# Patient Record
Sex: Male | Born: 1985 | Race: Black or African American | Hispanic: No | Smoking: Never smoker
Health system: Southern US, Community
[De-identification: ages and names within clinical notes are randomized; demographics above are authoritative.]

## PROBLEM LIST (undated history)

## (undated) ENCOUNTER — Emergency Department (HOSPITAL_COMMUNITY): Disposition: A | Discharge status: Home / Self Care | Payer: Self-pay

## (undated) HISTORY — PX: ARTHROSCOPIC REPAIR ACL: SUR80

---

## 2008-12-10 ENCOUNTER — Emergency Department (HOSPITAL_COMMUNITY): Admission: EM | Admit: 2008-12-10 | Discharge: 2008-12-10 | Payer: Self-pay | Admitting: Emergency Medicine

## 2009-03-20 ENCOUNTER — Emergency Department (HOSPITAL_COMMUNITY): Admission: EM | Admit: 2009-03-20 | Discharge: 2009-03-20 | Payer: Self-pay | Admitting: Emergency Medicine

## 2010-01-06 IMAGING — CR DG KNEE COMPLETE 4+V*R*
4 series · 4 of 4 positions shown · non-contrast
Comparison: None

CLINICAL DATA: Primarily medially.  Trauma.

RIGHT KNEE - COMPLETE 4+ VIEW

[t knee lat right]
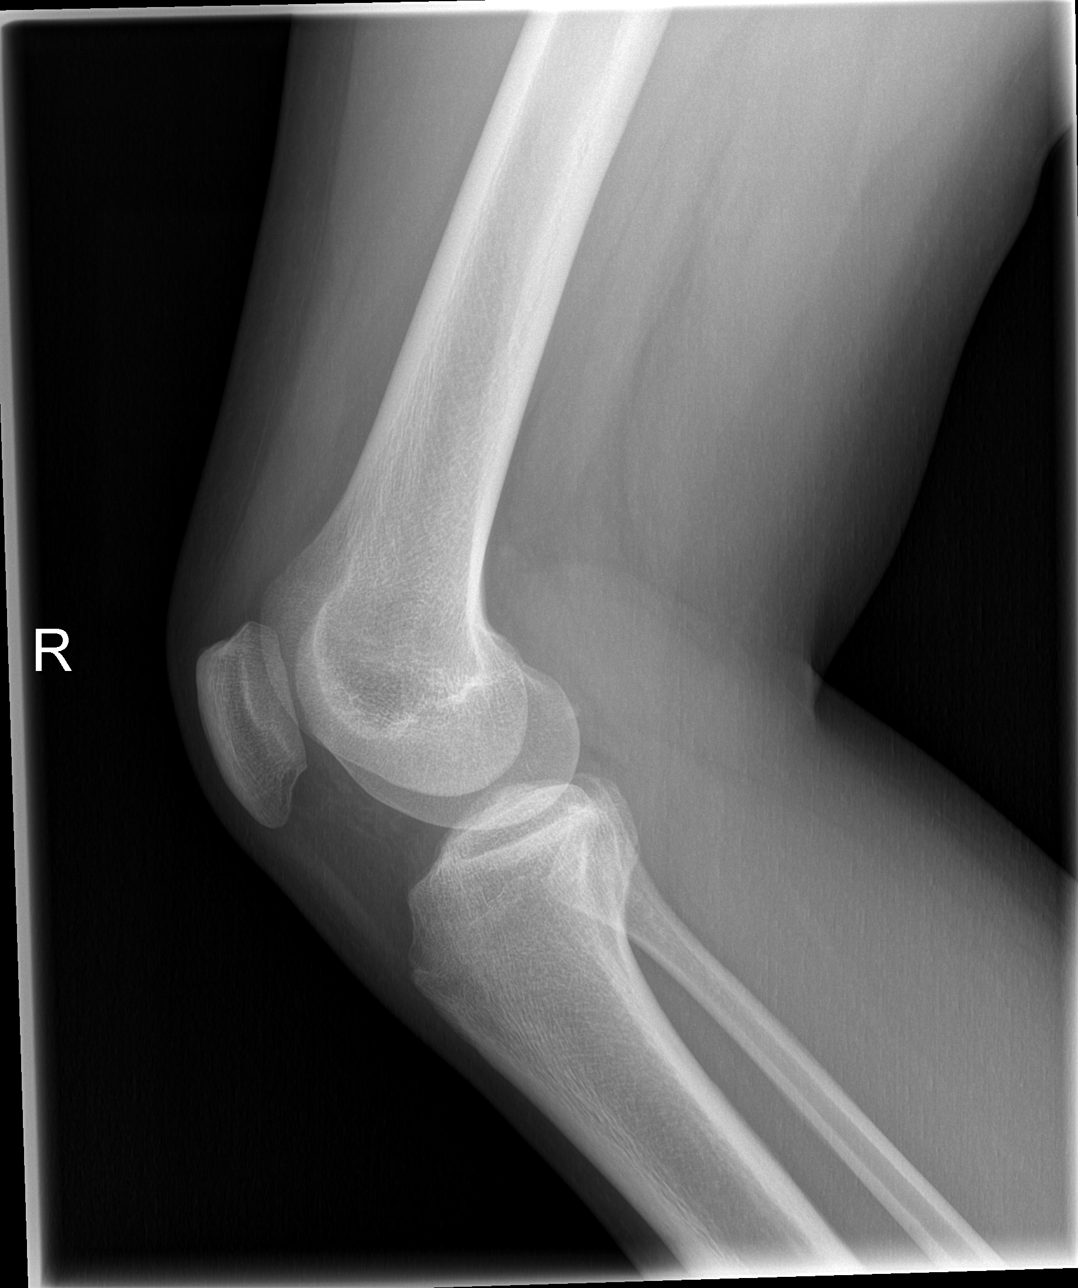

[t knee ap right]
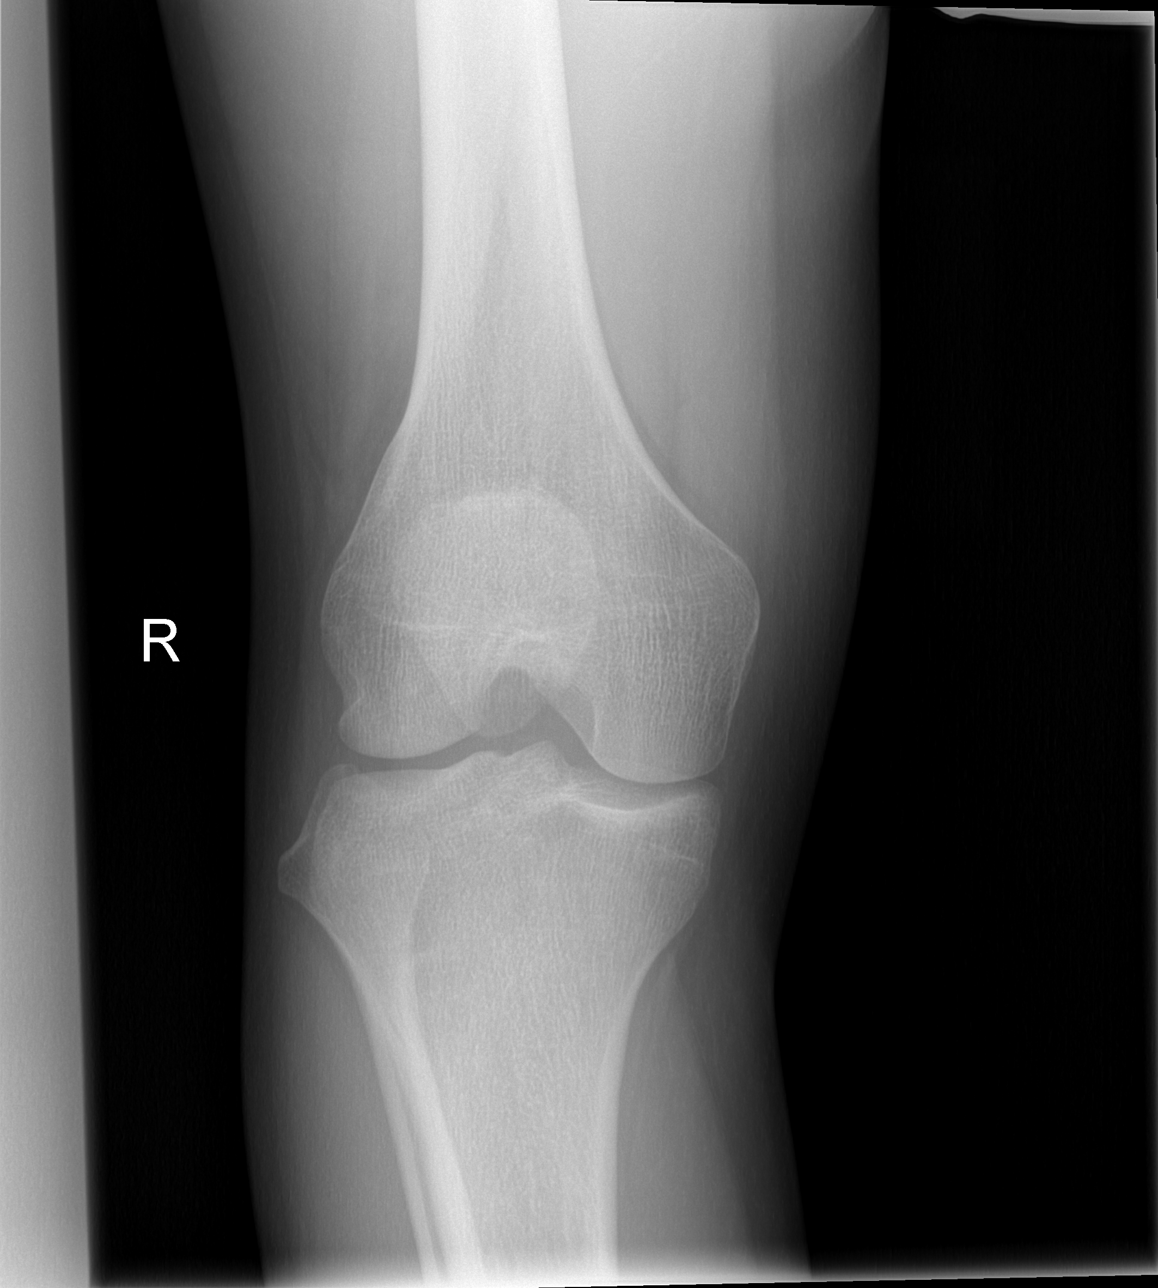

[t knee oblique right (1 of 2)]
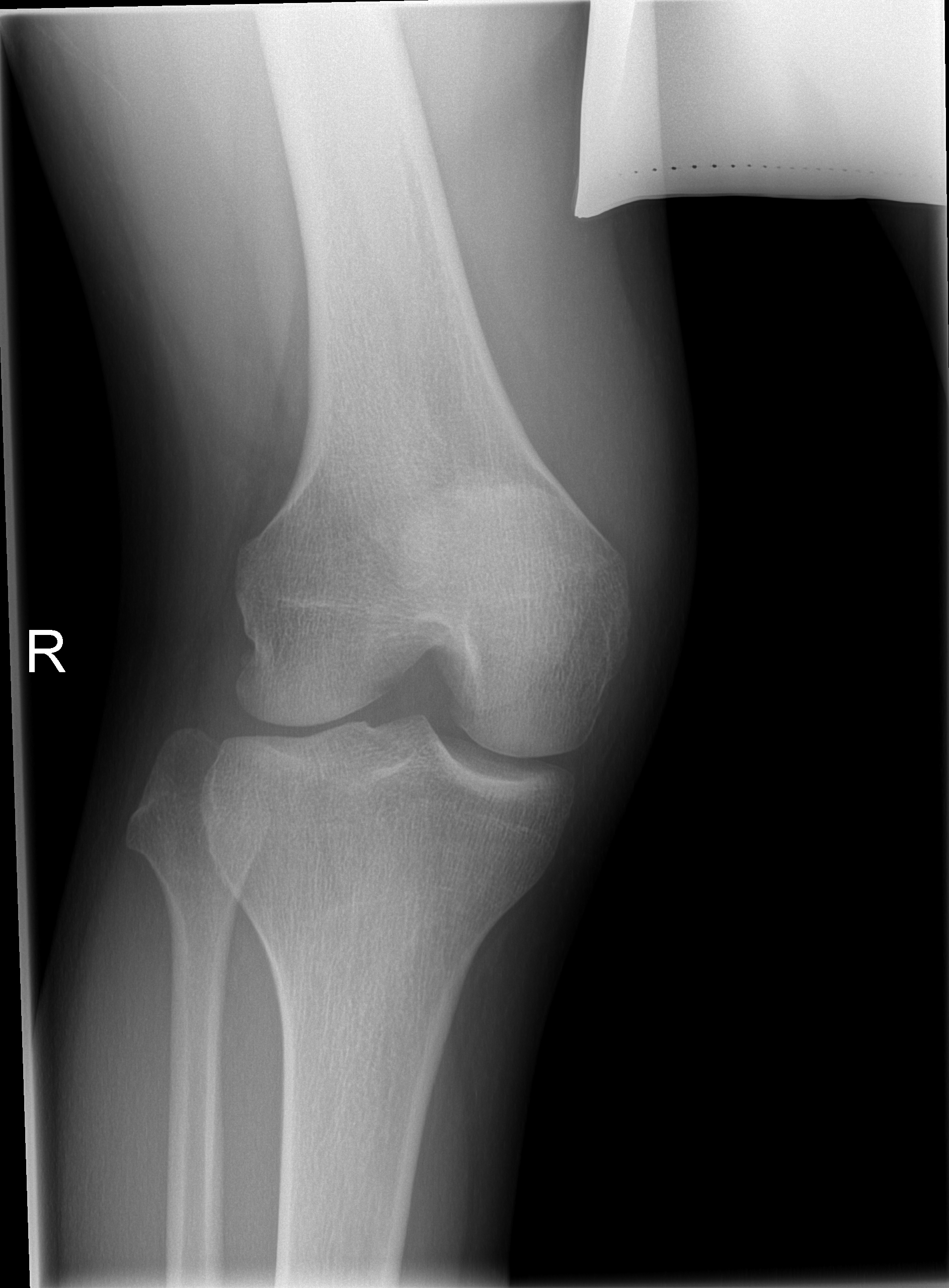

[t knee oblique right (2 of 2)]
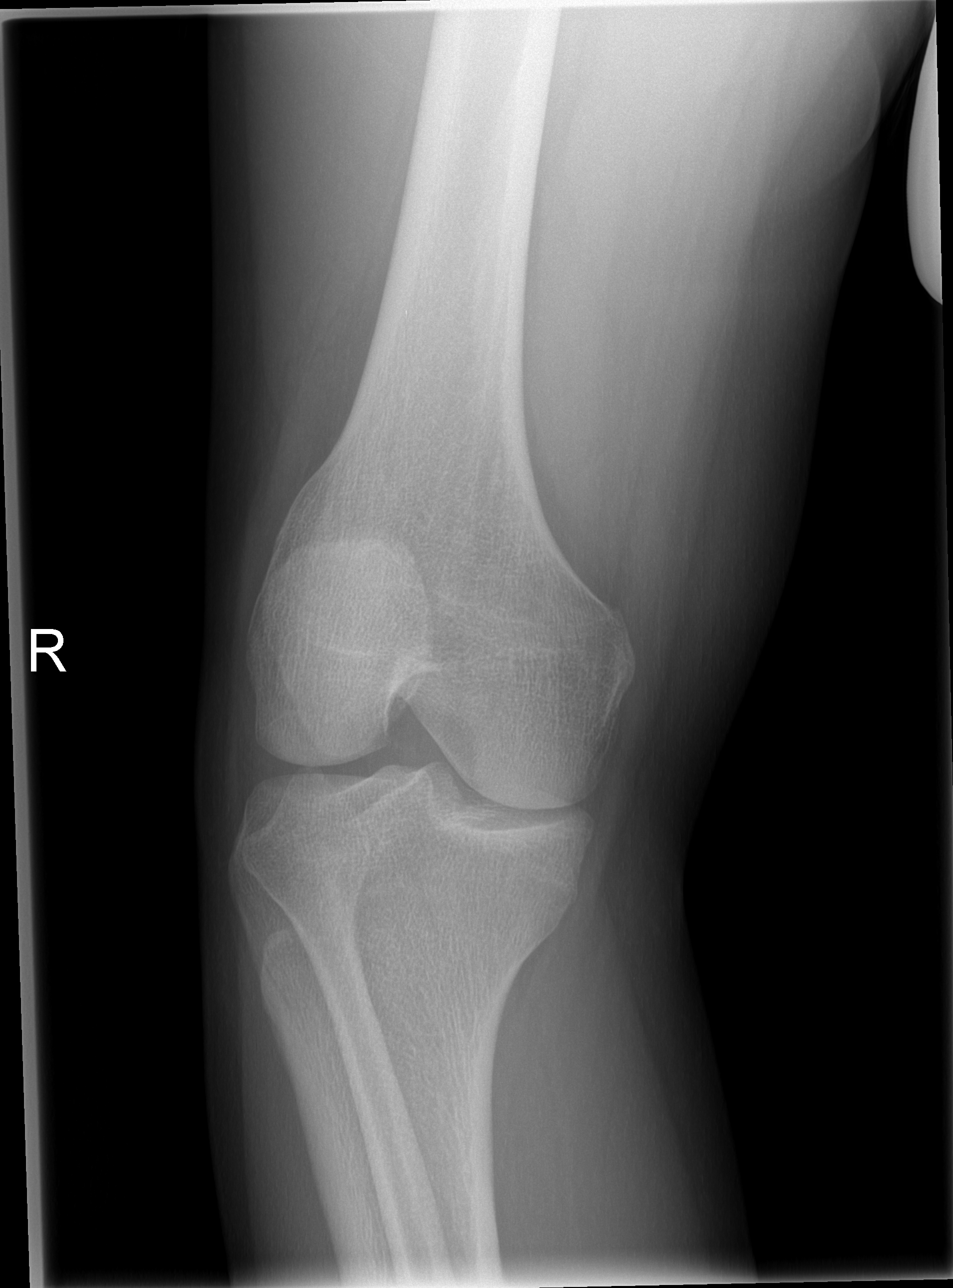

[4 of 4 positions shown; findings below may reference images not displayed]

FINDINGS: Mild soft tissue swelling medially. No acute fracture or
dislocation.  No joint effusion.  Joint spaces are maintained.
IMPRESSION: No acute fracture or dislocation about the right knee.

## 2015-06-08 ENCOUNTER — Encounter: Payer: Self-pay | Admitting: Sports Medicine

## 2015-06-08 ENCOUNTER — Ambulatory Visit (INDEPENDENT_AMBULATORY_CARE_PROVIDER_SITE_OTHER): Payer: 59 | Admitting: Sports Medicine

## 2015-06-08 VITALS — BP 108/70 | HR 69 | Ht 67.0 in | Wt 150.0 lb

## 2015-06-08 DIAGNOSIS — R269 Unspecified abnormalities of gait and mobility: Secondary | ICD-10-CM

## 2015-06-08 DIAGNOSIS — Z8739 Personal history of other diseases of the musculoskeletal system and connective tissue: Secondary | ICD-10-CM | POA: Diagnosis not present

## 2015-06-08 DIAGNOSIS — Z87828 Personal history of other (healed) physical injury and trauma: Secondary | ICD-10-CM

## 2015-06-08 NOTE — Progress Notes (Signed)
  Michael Burgess - 29 y.o. male MRN [id number]  Date of birth: 1986/06/26 Michael Burgess is a 29 y.o. male who presents today for evaluation for flat feet, and gait concerns in the setting of recurrent right ACL tears. Michael Burgess originally tore his right ACL in 2012 after taking a blow to the back of the knee while playing soccer. He recovered fully from this injury.   This past November 2015, he tore it again while planting and cutting in a cold whether soccer match. It was surgically repaired in March 2016 and he has progressed through physical therapy and rehabilitation to the point where he has been cleared for full physical activity. While working with his physical therapist, it was noted that Michael Burgess had flat feet that may be contributing to his recurrent injury. He currently has mild dorsal foot pain bilaterally after exercise but otherwise has no symptoms.  ROS no joint swellig/ no pain in left knee/ no rashes/ no numbness/ no LBP/ No sciatica   Exam:  Filed Vitals:   06/08/15 1138  BP: 108/70  Pulse: 69   Gen: NAD Cardiorespiratory - Normal respiratory effort/rate.  RRR MSK:  Inspection - knee and feet without gross deformity bilaterally, right knee stable without marked genu valgus on single-leg squats and stair steps No effusion/ ligaments now stable/ excellent quad strength/ exc hip strength  Stance - collapse of the medial longitudinal arch of feet bilaterally, static pronation of feet bilaterally (right > left)  Gait - dynamic pronation of the right foot > than left    Assessment: Michael Burgess is an active, healthy 29 year old who demonstrates good right knee stability and quad strength s/p recurrent right ACL injuries/repairs who demonstrates some static and dynamic biomechanics of his right stance and gait that likely predispose him to knee injury. Will treat today with a trial shoe insert for cross-training shoes to provide arch support and prevent dynamic pronation.  Plan: 1.  Trial bilateral sole inserts with right insert supported with arch pad for one month. 2. Slow return to high intensity physical activity 3. Continue right leg single-leg squats and strengthening exercises 3-4 times per week to prevent re-injury 4. Bring new soccer cleats by in one month for placement of arch support and possibly orthotics if needed   Michael Lo Lady Gary, Michael Burgess   Good documentation/  I agree with exam and note.  Michael Big, Michael Burgess  PGY-3Pediatrics [facility]

## 2015-06-08 NOTE — Assessment & Plan Note (Signed)
Sports insoles placed today On RT we added a scaphoid pad  After placement we were able to improve gait with much less pronaiton Able to place these in regular shoes and will add arch support to his soccer spikes  Reck prn or for custom orthotics if not getting full relief

## 2015-06-08 NOTE — Assessment & Plan Note (Signed)
Currently doing a dynamic ACL prevention program  He needs to cont this

## 2015-07-21 ENCOUNTER — Ambulatory Visit (INDEPENDENT_AMBULATORY_CARE_PROVIDER_SITE_OTHER): Payer: 59 | Admitting: Family Medicine

## 2015-07-21 ENCOUNTER — Encounter: Payer: Self-pay | Admitting: Family Medicine

## 2015-07-21 VITALS — BP 117/73 | HR 63 | Ht 67.0 in | Wt 150.0 lb

## 2015-07-21 DIAGNOSIS — R269 Unspecified abnormalities of gait and mobility: Secondary | ICD-10-CM | POA: Diagnosis not present

## 2015-07-25 NOTE — Progress Notes (Signed)
  Michael Burgess - 29 y.o. male MRN [id number]  Date of birth: 09/28/1985  CC: orthotics  SUBJECTIVE:   HPI  Michael Burgess is a 29 y.o. male who presents today for orthotic construction and insole fabrication for his soccer cleats - . Michael originally tore his right ACL in 2012 after taking a blow to the back of the knee while playing soccer. He recovered fully from this injury.  -  1 year ago in 11/15 he tore it again while planting and cutting in a cold whether soccer match. It was surgically repaired in March 2016. - While working with his physical therapist, it was [facility] that may be contributing to his recurrent injury.  - He was seen last month and trialed with bilateral green sports insoles with a scaphoid pad on the right. He liked how this feels. He would like to have orthotics made.  - He would also like to have scaphoid pads made for his soccer cleats. - He is currently doing a dynamic ACL prevention program   ROS:     14 point RoS negative other than that listed in HPI above.   HISTORY: Past Medical, Surgical, Social, and Family History Reviewed & Updated per EMR.    OBJECTIVE: BP 117/73 mmHg  Pulse 63  Ht 5\' 7"  (1.702 m)  Wt 150 lb (68.04 kg)  BMI 23.49 kg/m2  Physical Exam  Gen: NAD Cardiorespiratory - Normal respiratory effort/rate. RRR  MSK: Inspection - knees and feet without gross deformity bilaterally No effusion/ ligaments now stable/ excellent quad strength/ exc hip strength Stance - collapse of the medial longitudinal arch of feet bilaterally, static pronation of feet bilaterally (right > left) Gait - dynamic pronation of the right foot > than left  MEDICATIONS, LABS & OTHER ORDERS: Previous Medications   No medications on file   Modified Medications   No medications on file   New Prescriptions   No medications on file   Discontinued Medications   No medications on file  No orders of the defined types were placed in  this encounter.   ASSESSMENT & PLAN: Abnormaility of Gait, Pes planus, Recurrent ACL tear history:  - Orthotics constructed as below.  This corrected 50% of his pronation.  He reported good relief.  Considered adding a scaphoid pad or medial heel wedge, but decided not to. He will return if he feels he needs further adjustment.  - Added a pared down scaphoid pad to the right soccer insoles. He was provided with a replacement as well.  - He will continue with his ACL prevention program.   - f/u PRN     Patient was fitted for a : standard, cushioned, semi-rigid orthotic. The orthotic was heated and afterward the patient stood on the orthotic blank positioned on the orthotic stand. The patient was positioned in subtalar neutral position and 10 degrees of ankle dorsiflexion in a weight bearing stance. After completion of molding, a stable base was applied to the orthotic blank. The blank was ground to a stable position for weight bearing.   Base: Blue EVA Posting:none  Additional orthotic padding:considered using medial heel wedge on the right, but did not.  40 minutes was spend in discussion, construction, and adjustment of the orthotics, more than half of which was face to face time.

## 2015-11-07 ENCOUNTER — Ambulatory Visit (INDEPENDENT_AMBULATORY_CARE_PROVIDER_SITE_OTHER): Payer: 59 | Admitting: Family Medicine

## 2015-11-07 VITALS — BP 112/74 | HR 66 | Temp 97.5°F | Resp 16 | Ht 69.0 in | Wt 145.0 lb

## 2015-11-07 DIAGNOSIS — G8929 Other chronic pain: Secondary | ICD-10-CM

## 2015-11-07 DIAGNOSIS — R1013 Epigastric pain: Secondary | ICD-10-CM | POA: Diagnosis not present

## 2015-11-07 MED ORDER — ESOMEPRAZOLE MAGNESIUM 40 MG PO CPDR: 40.0000 mg | DELAYED_RELEASE_CAPSULE | Freq: Every day | ORAL | Status: DC

## 2015-11-07 NOTE — Patient Instructions (Addendum)
Take one Align tablet daily  Helicobacter Pylori Antibodies Test WHY AM I HAVING THIS TEST? This is a blood test that looks for bacteria called Helicobacter pylori (H. pylori). H. pylori is a germ that can be found in the cells that line the stomach. Having high levels of H. pylori in your stomach puts you at risk for stomach ulcers and small bowel ulcers, long-term (chronic) inflammation of the lining of the stomach, or even ulcers that may occur in the canal that runs from the mouth to the stomach (esophagus). Presence of H. pylori can also increase your risk for stomach cancer if it is left untreated. Most people with H. pylori in their stomach bacteria have no symptoms. Your health care provider may ask you to have this test if you have symptoms of a stomach ulcer or small bowel ulcer, such as stomach pain before or after eating, heartburn, or nausea repeatedly after eating. WHAT KIND OF SAMPLE IS TAKEN? A blood sample is required for this test. It is usually collected by inserting a needle into a vein or sticking a finger with a small needle. HOW DO I PREPARE FOR THE TEST? There is no preparation required for this test. WHAT ARE THE REFERENCE RANGES? Reference ranges are considered healthy ranges established after testing a large group of healthy people. Reference ranges may vary among different people, labs, and hospitals. It is your responsibility to obtain your test results. Ask the lab or department performing the test when and how you will get your results. Your test results will be reported as positive, negative, or equivocal. Equivocal means that your results are neither positive nor negative. References ranges for these results are as follows:  Less than or equal to 30 units/mL. This is negative.  Greater than or equal to 40 units/mL. This is positive.  30.01-39.99 units/mL. This is equivocal. WHAT DO THE RESULTS MEAN? Test results that are higher than normal may indicate numerous  health conditions. These may include:  Short-term or long-term irritation of the stomach lining (gastritis).  Small bowel ulcer.  Stomach ulcer.  Stomach cancer. Talk with your health care provider to discuss your results, treatment options, and if necessary, the need for more tests. Talk with your health care provider if you have any questions about your results.   This information is not intended to replace advice given to you by your health care provider. Make sure you discuss any questions you have with your health care provider.   Document Released: 09/12/2004 Document Revised: 09/09/2014 Document Reviewed: 12/31/2013 Elsevier Interactive Patient Education [employer]2016 Elsevier Inc.

## 2015-11-07 NOTE — Progress Notes (Signed)
Patient ID: Michael Burgess, male   DOB: 20-Dec-1985, 30 y.o.   MRN: [id number]  By signing my name below, I, Essence Howell, attest that this documentation has been prepared under the direction and in the presence of Elvina Sidle, MD Electronically Signed: Charline Bills, ED Scribe 11/07/2015 at 10:28 AM.  Patient ID: Michael Burgess MRN: [id number], DOB: 1986/01/20, 30 y.o. Date of Encounter: 11/07/2015, 10:19 AM  Primary Physician: No PCP Per Patient  Chief Complaint:  Chief Complaint  Patient presents with  . Abdominal Pain    HPI: 30 y.o. year old male with history below presents with intermittent abdominal pain for the past few weeks. Pt describes pain as a sharp sensation that radiates into his back and is exacerbated after eating and while sleeping. He reports daily pain that has worsened to at least 2 episodes daily. Pt states that abdominal pain typically self-resolves. No treatments tried PTA. He denies fever, nausea, diarrhea.   Pt is a Charity fundraiser for [employer].   History reviewed. No pertinent past medical history.   Home Meds: Prior to Admission medications   Medication Sig Start Date End Date Taking? Authorizing Provider  meloxicam (MOBIC) 7.5 MG tablet Take 7.5 mg by mouth daily.   Yes Historical Provider, MD    Allergies: No Known Allergies  Social History   Social History  . Marital Status: Legally Separated    Spouse Name: N/A  . Number of Children: N/A  . Years of Education: N/A   Occupational History  . Not on file.   Social History Main Topics  . Smoking status: Never Smoker   . Smokeless tobacco: Not on file  . Alcohol Use: Not on file  . Drug Use: Not on file  . Sexual Activity: Not on file   Other Topics Concern  . Not on file   Social History Narrative    Review of Systems: Constitutional: negative for chills, -fever, night sweats, weight changes, or fatigue  HEENT: negative for vision changes, hearing loss, congestion, rhinorrhea, ST,  epistaxis, or sinus pressure Cardiovascular: negative for chest pain or palpitations Respiratory: negative for hemoptysis, wheezing, shortness of breath, or cough Abdominal: negative for nausea, vomiting, -diarrhea, or constipation, +abdominal pain Dermatological: negative for rash Neurologic: negative for headache, dizziness, or syncope All other systems reviewed and are otherwise negative with the exception to those above and in the HPI.  Physical Exam: Blood pressure 112/74, pulse 66, temperature 97.5 F (36.4 C), temperature source Oral, resp. rate 16, height  (1.753 m), weight 145 lb (65.772 kg), SpO2 99 %., Body mass index is 21.4 kg/(m^2). General: Well developed, well nourished, in no acute distress. Head: Normocephalic, atraumatic, eyes without discharge, sclera non-icteric, nares are without discharge. Bilateral auditory canals clear, TM's are without perforation, pearly grey and translucent with reflective cone of light bilaterally. Oral cavity moist, posterior pharynx without exudate, erythema, peritonsillar abscess, or post nasal drip.  Neck: Supple. No thyromegaly. Full ROM. No lymphadenopathy. Lungs: Clear bilaterally to auscultation without wheezes, rales, or rhonchi. Breathing is unlabored. Heart: RRR with S1 S2. No murmurs, rubs, or gallops appreciated. Abdomen: Hyperactive bowel sounds. No tenderness. No HSM. No rebound/guarding. No obvious abdominal masses. Msk:  Strength and tone normal for age.  Extremities/Skin: Warm and dry. No clubbing or cyanosis. No edema. No rashes or suspicious lesions. Neuro: Alert and oriented X 3. Moves all extremities spontaneously. Gait is normal. CNII-XII grossly in tact. Psych:  Responds to questions appropriately with a normal affect.  Labs:  ASSESSMENT AND PLAN:  30 y.o. year old male with  1. Abdominal pain, chronic, epigastric    This chart was scribed in my presence and reviewed by me personally.    ICD-9-CM ICD-10-CM   1.  Abdominal pain, chronic, epigastric 789.06 R10.13 H. pylori breath test   338.29 G89.29 esomeprazole (NEXIUM) 40 MG capsule   Align samples provided  Signed, Elvina SidleKurt Corneisha Alvi, MD 11/07/2015 10:19 AM

## 2015-11-08 LAB — H. PYLORI BREATH TEST: H. pylori Breath Test: DETECTED — AB

## 2015-11-09 MED ORDER — AMOXICILLIN 500 MG PO CAPS: 1000.0000 mg | ORAL_CAPSULE | Freq: Two times a day (BID) | ORAL | Status: DC

## 2015-11-09 MED ORDER — METRONIDAZOLE 500 MG PO TABS: 500.0000 mg | ORAL_TABLET | Freq: Two times a day (BID) | ORAL | Status: DC

## 2015-11-09 NOTE — Addendum Note (Signed)
Addended by: Felix AhmadiFRANSEN, Karlee Staff A on: 11/09/2015 09:18 AM   Modules accepted: Orders

## 2016-12-30 ENCOUNTER — Encounter: Payer: Self-pay | Admitting: Physician Assistant

## 2016-12-30 ENCOUNTER — Ambulatory Visit (INDEPENDENT_AMBULATORY_CARE_PROVIDER_SITE_OTHER): Payer: 59

## 2016-12-30 ENCOUNTER — Ambulatory Visit (INDEPENDENT_AMBULATORY_CARE_PROVIDER_SITE_OTHER): Payer: 59 | Admitting: Physician Assistant

## 2016-12-30 VITALS — BP 143/67 | HR 95 | Temp 97.7°F | Resp 16 | Ht 69.0 in | Wt 163.0 lb

## 2016-12-30 DIAGNOSIS — T148XXA Other injury of unspecified body region, initial encounter: Secondary | ICD-10-CM | POA: Diagnosis not present

## 2016-12-30 DIAGNOSIS — M79674 Pain in right toe(s): Secondary | ICD-10-CM | POA: Diagnosis not present

## 2016-12-30 NOTE — Patient Instructions (Addendum)
  Use ibuprofen 400 mg every 6-8 hours as needed.  Ice the toe often for about 20 minutes.  Wear the post op shoe at all times aside from bathing to keep the toe in a good position to heal.    IF you received an x-ray today, you will receive an invoice from [facility]. Please contact [facility] at [phone] with questions or concerns regarding your invoice.   IF you received labwork today, you will receive an invoice from [facility]. Please contact [facility] at [phone] with questions or concerns regarding your invoice.   Our billing staff will not be able to assist you with questions regarding bills from these companies.  You will be contacted with the lab results as soon as they are available. The fastest way to get your results is to activate your My Chart account. Instructions are located on the last page of this paperwork. If you have not heard from [place] regarding the results in 2 weeks, please contact this office.

## 2016-12-30 NOTE — Progress Notes (Signed)
  12/30/2016 10:21 AM   DOB: 11-11-1985 / MRN: [id number]  SUBJECTIVE:  Michael Burgess is a 31 y.o. male presenting for right great toe pain that started after "hitting someone else's heel" yesterday during a soccer game.  Can ambulate but with quite a lot of pain.  Has not been taking anything for pain.  Feels that he is getting worse. No history of gout.   He has No Known Allergies.   He  has no past medical history on file.    He  reports that he has never smoked. He has never used smokeless tobacco. He  has no sexual activity history on file. The patient  has a past surgical history that includes Arthroscopic repair ACL.  His family history is not on file.  Review of Systems  Constitutional: Negative for chills, diaphoresis and fever.  Gastrointestinal: Negative for nausea.  Musculoskeletal: Positive for joint pain. Negative for back pain, falls, myalgias and neck pain.  Skin: Negative for rash.  Neurological: Negative for dizziness.    The problem list and medications were reviewed and updated by myself where necessary and exist elsewhere in the encounter.   OBJECTIVE:  BP (!) 143/67   Pulse 95   Temp 97.7 F (36.5 C) (Oral)   Resp 16   Ht  (1.753 m)   Wt 163 lb (73.9 kg)   SpO2 95%   BMI 24.07 kg/m   Physical Exam  Constitutional: He is active and cooperative.  Cardiovascular: Normal rate.   Pulmonary/Chest: Effort normal. No tachypnea.  Musculoskeletal: He exhibits tenderness (right great toe at the DIP.).  Neurological: He is alert.  Skin: Skin is warm.  Vitals reviewed.      No results found for this or any previous visit (from the past 72 hour(s)).  Dg Toe Great Right  Result Date: 12/30/2016 CLINICAL DATA:  Soccer injury with first toe pain, initial encounter EXAM: RIGHT GREAT TOE COMPARISON:  None. FINDINGS: There is a small avulsion at the base of the first distal phalanx dorsally. No other focal abnormality is seen. No soft tissue abnormality is  noted. IMPRESSION: Small avulsion at the base of the first distal phalanx. Electronically Signed   By: Alcide Clever M.D.   On: 12/30/2016 10:04    ASSESSMENT AND PLAN:  Michael Burgess was seen today for toe injury.  Diagnoses and all orders for this visit:  Great toe pain, right -     DG Toe Great Right; Future -     Post-op shoe  Avulsion injury: Dorsal aspect.  Post op shoe for three weeks.  RTC at that time if continuing to have unimproving pain.  ICE and ibuprofen per avs.     The patient is advised to call or return to clinic if he does not see an improvement in symptoms, or to seek the care of the closest emergency department if he worsens with the above plan.   Deliah Boston, MHS, PA-C [facility] 12/30/2016 10:21 AM

## 2017-10-18 IMAGING — DX DG TOE GREAT 2+V*R*
3 series · 3 of 3 positions shown · non-contrast
Comparison: None.

CLINICAL DATA: Soccer injury with first toe pain, initial encounter

EXAM:
RIGHT GREAT TOE

[toe ap]
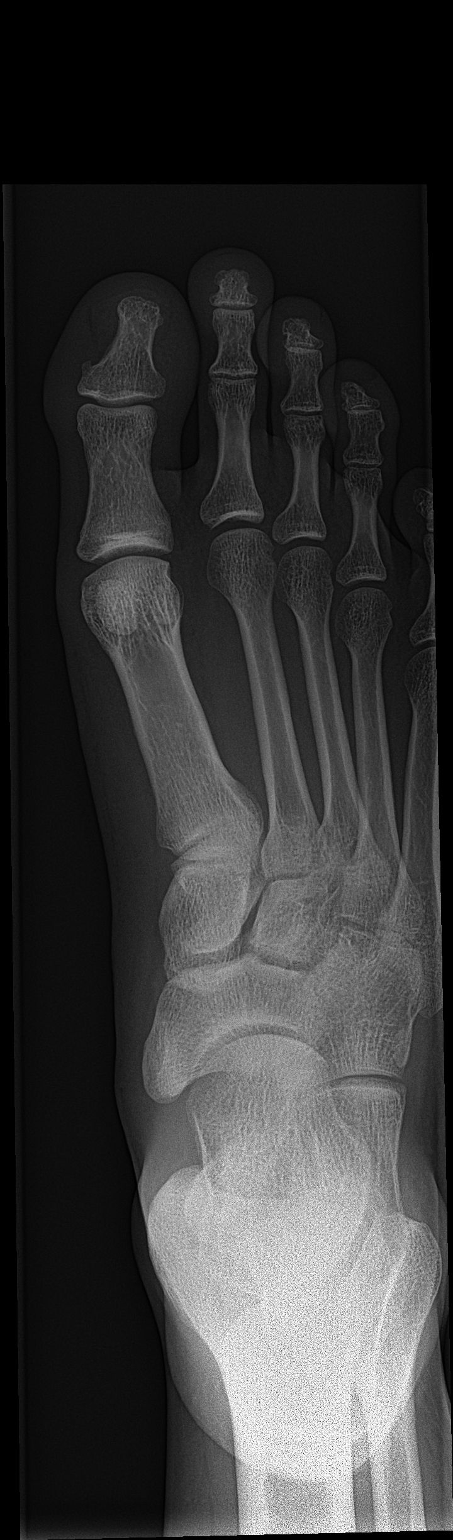

[toe obl]
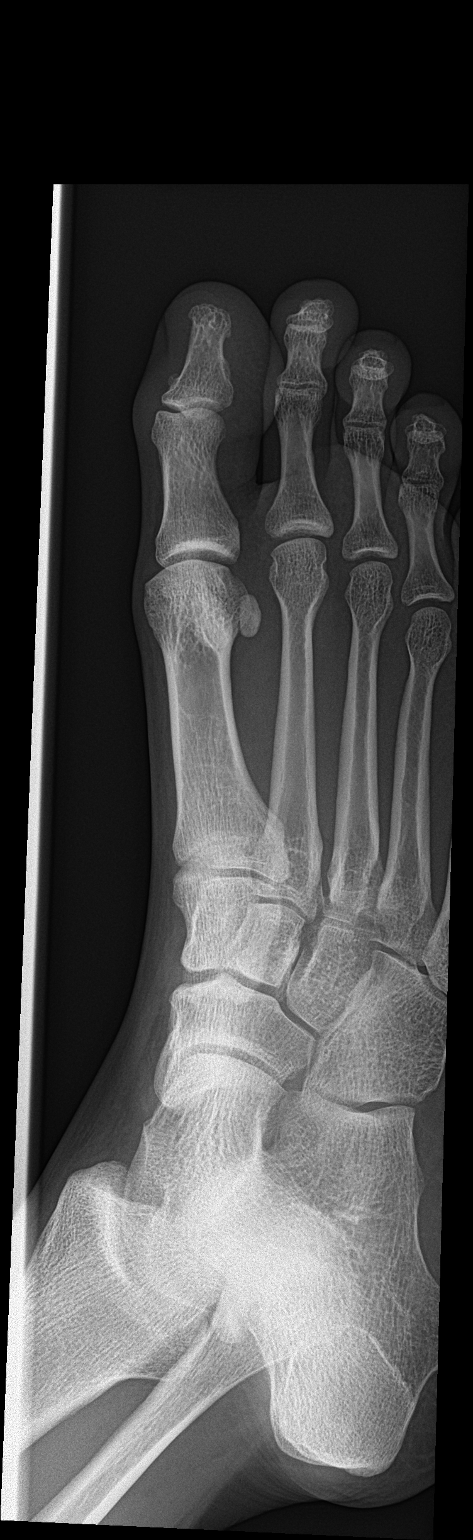

[toe lat]
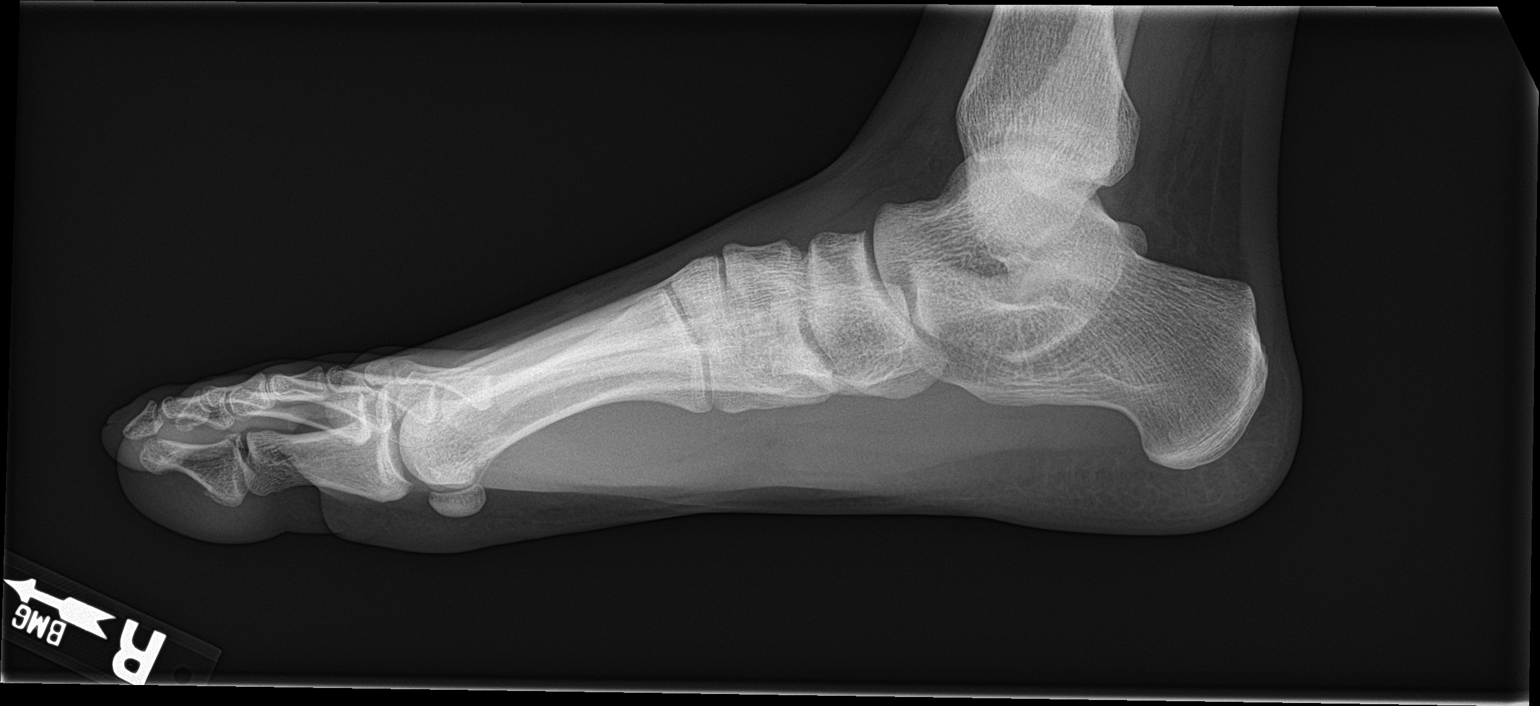

[3 of 3 positions shown; findings below may reference images not displayed]

FINDINGS: There is a small avulsion at the base of the first distal phalanx
dorsally. No other focal abnormality is seen. No soft tissue
abnormality is noted.
IMPRESSION: Small avulsion at the base of the first distal phalanx.
